# Patient Record
Sex: Female | Born: 1990 | Race: White | Hispanic: No | Marital: Single | Smoking: Never smoker
Health system: Southern US, Community
[De-identification: ages and names within clinical notes are randomized; demographics above are authoritative.]

## PROBLEM LIST (undated history)

## (undated) DIAGNOSIS — L709 Acne, unspecified: Secondary | ICD-10-CM

## (undated) DIAGNOSIS — F909 Attention-deficit hyperactivity disorder, unspecified type: Secondary | ICD-10-CM

## (undated) DIAGNOSIS — F419 Anxiety disorder, unspecified: Secondary | ICD-10-CM

---

## 2016-06-21 ENCOUNTER — Ambulatory Visit (HOSPITAL_COMMUNITY)
Admission: EM | Admit: 2016-06-21 | Discharge: 2016-06-21 | Disposition: A | Discharge status: Home / Self Care | Payer: BLUE CROSS/BLUE SHIELD | Attending: Family Medicine | Admitting: Family Medicine

## 2016-06-21 ENCOUNTER — Encounter (HOSPITAL_COMMUNITY): Payer: Self-pay | Admitting: Family Medicine

## 2016-06-21 DIAGNOSIS — L237 Allergic contact dermatitis due to plants, except food: Secondary | ICD-10-CM | POA: Diagnosis not present

## 2016-06-21 MED ORDER — PREDNISONE 20 MG PO TABS: 20.0000 mg | ORAL_TABLET | Freq: Every day | ORAL | 0 refills | Status: AC

## 2016-06-21 NOTE — ED Triage Notes (Signed)
Pt here for poison ivy. sts hx of same. Pt works outside.

## 2016-06-21 NOTE — Discharge Instructions (Signed)
Prescription given for prednisone taper. Also treat symptomatically with cool compress, oatmeal bath, calamine lotion, burrow's solution, and benadryl. No indication for antibiotic at this moment. Follow up with primary care doctor or return if rash does not improve.

## 2016-06-21 NOTE — ED Provider Notes (Signed)
CSN: [id number]     Arrival date & time 06/21/16  1448 History   First MD Initiated Contact with Patient 06/21/16 1719     Chief Complaint  Patient presents with  . Poison Ivy   (Consider location/radiation/quality/duration/timing/severity/associated sxs/prior Treatment) Nicole Henson is a well-appearing 25 y.o female, presents today for poison ivy exposure. She works outside a lot and believes she got exposed this past weekend. She reports rash on her arms, her back, and on her ankles. Rash have been present for 4-5 days. Rash is red and itchy. States "I am trying very hard not to scratch it". She have tried benadryl, which makes her very sleepy. She have also tried calamine lotion which doesn't seem to help. She have had poison ivy before and states that it feels the same today.     Poison Ivy     History reviewed. No pertinent past medical history. History reviewed. No pertinent surgical history. History reviewed. No pertinent family history. Social History  Substance Use Topics  . Smoking status: Never Smoker  . Smokeless tobacco: Never Used  . Alcohol use Not on file   OB History    No data available     Review of Systems  Skin: Positive for rash.  All other systems reviewed and are negative.   Allergies  Review of patient's allergies indicates no known allergies.  Home Medications   Prior to Admission medications   Medication Sig Start Date End Date Taking? Authorizing Provider  spironolactone (ALDACTONE) 100 MG tablet Take 100 mg by mouth 2 (two) times daily.   Yes Historical Provider, MD  predniSONE (DELTASONE) 20 MG tablet Take 1 tablet (20 mg total) by mouth daily with breakfast. Take 3 tablets for day 1-3, Take 2 tablets for day 4-6, Take 1 tablets for day 7-9. 06/21/16 06/30/16  Lucia EstelleFeng Kenzee Bassin, NP   Meds Ordered and Administered this Visit  Medications - No data to display  BP 123/68   Pulse 85   Temp 98.5 F (36.9 C)   Resp 18   SpO2 97%  No data  found.   Physical Exam  Constitutional: She is oriented to person, place, and time. She appears well-developed and well-nourished.  HENT:  Head: Normocephalic.  Cardiovascular: Normal rate.   Pulmonary/Chest: Effort normal.  Neurological: She is alert and oriented to person, place, and time.  Skin:  She has papulovesicular rash mainly on her bilateral arms, some on her left ankle, and some on her back. Majority of the rash are in linear formation, although some are discrete and confluent.   Psychiatric: She has a normal mood and affect.  Nursing note and vitals reviewed.   Urgent Care Course   Clinical Course    Procedures (including critical care time)  Labs Review Labs Reviewed - No data to display  Imaging Review No results found.    MDM   1. Poison ivy dermatitis     Rash consistent with poison ivy dermatitis. Prescription given for prednisone taper. Also treat symptomatically with cool compress, oatmeal bath, calamine lotion, burrow's solution, and benadryl. No indication for antibiotic at this moment. Follow up with PCP or return if rash does not improve. All questions answered. Discharge instruction given.     Lucia EstelleFeng Jb Dulworth, NP 06/21/16 1732

## 2016-06-30 ENCOUNTER — Emergency Department (HOSPITAL_COMMUNITY)
Admission: EM | Admit: 2016-06-30 | Discharge: 2016-06-30 | Disposition: A | Discharge status: Home / Self Care | Payer: BLUE CROSS/BLUE SHIELD | Attending: Emergency Medicine | Admitting: Emergency Medicine

## 2016-06-30 ENCOUNTER — Encounter (HOSPITAL_COMMUNITY): Payer: Self-pay | Admitting: *Deleted

## 2016-06-30 ENCOUNTER — Emergency Department (HOSPITAL_COMMUNITY): Payer: BLUE CROSS/BLUE SHIELD

## 2016-06-30 DIAGNOSIS — Y999 Unspecified external cause status: Secondary | ICD-10-CM | POA: Diagnosis not present

## 2016-06-30 DIAGNOSIS — S0992XA Unspecified injury of nose, initial encounter: Secondary | ICD-10-CM | POA: Insufficient documentation

## 2016-06-30 DIAGNOSIS — Y92410 Unspecified street and highway as the place of occurrence of the external cause: Secondary | ICD-10-CM | POA: Diagnosis not present

## 2016-06-30 DIAGNOSIS — S60212A Contusion of left wrist, initial encounter: Secondary | ICD-10-CM | POA: Diagnosis not present

## 2016-06-30 DIAGNOSIS — M542 Cervicalgia: Secondary | ICD-10-CM | POA: Diagnosis not present

## 2016-06-30 DIAGNOSIS — Y939 Activity, unspecified: Secondary | ICD-10-CM | POA: Diagnosis not present

## 2016-06-30 DIAGNOSIS — S20312A Abrasion of left front wall of thorax, initial encounter: Secondary | ICD-10-CM | POA: Diagnosis not present

## 2016-06-30 MED ORDER — HYDROCODONE-ACETAMINOPHEN 5-325 MG PO TABS
1.0000 | ORAL_TABLET | Freq: Once | ORAL | Status: AC
  Administered 2016-06-30: 1 via ORAL
  Filled 2016-06-30: qty 1

## 2016-06-30 MED ORDER — NAPROXEN 250 MG PO TABS
500.0000 mg | ORAL_TABLET | Freq: Once | ORAL | Status: AC
  Administered 2016-06-30: 500 mg via ORAL
  Filled 2016-06-30: qty 2

## 2016-06-30 NOTE — ED Notes (Signed)
Pt departed in NAD, refused use of a wheelchair.  
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
## 2016-06-30 NOTE — ED Triage Notes (Signed)
Ice pack given
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
## 2016-06-30 NOTE — ED Notes (Signed)
Patient transported to X-ray 
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
## 2019-01-22 ENCOUNTER — Other Ambulatory Visit: Payer: Self-pay | Admitting: Obstetrics & Gynecology

## 2019-01-22 DIAGNOSIS — N644 Mastodynia: Secondary | ICD-10-CM

## 2019-01-22 DIAGNOSIS — N6320 Unspecified lump in the left breast, unspecified quadrant: Secondary | ICD-10-CM

## 2019-02-12 ENCOUNTER — Encounter (HOSPITAL_COMMUNITY): Payer: Self-pay | Admitting: Emergency Medicine

## 2019-02-12 ENCOUNTER — Ambulatory Visit (HOSPITAL_COMMUNITY)
Admission: EM | Admit: 2019-02-12 | Discharge: 2019-02-12 | Disposition: A | Discharge status: Home / Self Care | Payer: BLUE CROSS/BLUE SHIELD | Attending: Family Medicine | Admitting: Family Medicine

## 2019-02-12 ENCOUNTER — Other Ambulatory Visit: Payer: Self-pay

## 2019-02-12 DIAGNOSIS — S61411A Laceration without foreign body of right hand, initial encounter: Secondary | ICD-10-CM

## 2019-02-12 DIAGNOSIS — W260XXA Contact with knife, initial encounter: Secondary | ICD-10-CM

## 2019-02-12 MED ORDER — LIDOCAINE-EPINEPHRINE (PF) 2 %-1:200000 IJ SOLN
INTRAMUSCULAR | Status: AC
  Filled 2019-02-12: qty 20

## 2019-02-12 NOTE — Discharge Instructions (Signed)
Bandage applied Keep covered for next and dry for next 24-48 hours.  After than you may gently clean with warm water and mild soap.  Avoid submerging wound in water. Change dressing daily and apply a thin layer of neosporin.  You may have sutures removed in 7-10 days Take OTC ibuprofen or tylenol as needed for pain releif Return or go to the ED if you have any new or worsening symptoms such as increased pain, redness, swelling, drainage, discharge, decreased range of motion of extremity, etc..
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
## 2019-02-24 ENCOUNTER — Ambulatory Visit
Admission: RE | Admit: 2019-02-24 | Discharge: 2019-02-24 | Disposition: A | Discharge status: Home / Self Care | Payer: BLUE CROSS/BLUE SHIELD | Source: Ambulatory Visit | Attending: Obstetrics & Gynecology | Admitting: Obstetrics & Gynecology

## 2019-02-24 ENCOUNTER — Other Ambulatory Visit: Payer: Self-pay

## 2019-02-24 ENCOUNTER — Other Ambulatory Visit: Payer: Self-pay | Admitting: Obstetrics & Gynecology

## 2019-02-24 DIAGNOSIS — N644 Mastodynia: Secondary | ICD-10-CM

## 2019-02-24 DIAGNOSIS — N6320 Unspecified lump in the left breast, unspecified quadrant: Secondary | ICD-10-CM

## 2019-09-01 ENCOUNTER — Ambulatory Visit
Admission: RE | Admit: 2019-09-01 | Discharge: 2019-09-01 | Disposition: A | Discharge status: Home / Self Care | Payer: BLUE CROSS/BLUE SHIELD | Source: Ambulatory Visit | Attending: Obstetrics & Gynecology | Admitting: Obstetrics & Gynecology

## 2019-09-01 ENCOUNTER — Other Ambulatory Visit: Payer: Self-pay

## 2019-09-01 ENCOUNTER — Other Ambulatory Visit: Payer: Self-pay | Admitting: Obstetrics & Gynecology

## 2019-09-01 DIAGNOSIS — N644 Mastodynia: Secondary | ICD-10-CM

## 2019-09-01 DIAGNOSIS — N6320 Unspecified lump in the left breast, unspecified quadrant: Secondary | ICD-10-CM

## 2019-12-04 ENCOUNTER — Ambulatory Visit (HOSPITAL_COMMUNITY)
Admission: EM | Admit: 2019-12-04 | Discharge: 2019-12-04 | Disposition: A | Discharge status: Home / Self Care | Payer: BLUE CROSS/BLUE SHIELD | Attending: Family Medicine | Admitting: Family Medicine

## 2019-12-04 ENCOUNTER — Other Ambulatory Visit: Payer: Self-pay

## 2019-12-04 ENCOUNTER — Encounter (HOSPITAL_COMMUNITY): Payer: Self-pay

## 2019-12-04 DIAGNOSIS — Z3202 Encounter for pregnancy test, result negative: Secondary | ICD-10-CM | POA: Diagnosis not present

## 2019-12-04 DIAGNOSIS — R1032 Left lower quadrant pain: Secondary | ICD-10-CM | POA: Diagnosis not present

## 2019-12-04 DIAGNOSIS — R300 Dysuria: Secondary | ICD-10-CM | POA: Insufficient documentation

## 2019-12-04 HISTORY — DX: Anxiety disorder, unspecified: F41.9

## 2019-12-04 HISTORY — DX: Attention-deficit hyperactivity disorder, unspecified type: F90.9

## 2019-12-04 HISTORY — DX: Acne, unspecified: L70.9

## 2019-12-04 LAB — POCT URINALYSIS DIP (DEVICE)
Bilirubin Urine: NEGATIVE
Glucose, UA: NEGATIVE mg/dL
Hgb urine dipstick: NEGATIVE
Ketones, ur: NEGATIVE mg/dL
Leukocytes,Ua: NEGATIVE
Nitrite: NEGATIVE
Protein, ur: NEGATIVE mg/dL
Specific Gravity, Urine: 1.01 (ref 1.005–1.030)
Urobilinogen, UA: 0.2 mg/dL (ref 0.0–1.0)
pH: 6.5 (ref 5.0–8.0)

## 2019-12-04 LAB — POC URINE PREG, ED
Preg Test, Ur: NEGATIVE
Preg Test, Ur: NEGATIVE

## 2019-12-04 LAB — POCT PREGNANCY, URINE: Preg Test, Ur: NEGATIVE

## 2019-12-04 MED ORDER — CIPROFLOXACIN HCL 500 MG PO TABS: 500.0000 mg | ORAL_TABLET | Freq: Two times a day (BID) | ORAL | 0 refills | Status: AC

## 2019-12-04 NOTE — ED Triage Notes (Signed)
Pt c/o LLQ pain and "swelling" since Monday. Also reports painful urination, frequency, urgency and hematuria since Tuesday. Denies n/v, fever, chills. Also c/o lower right back pain, but states h/o similar pain.  States had normal BM yesterday and LLQ pain improved slightly.

## 2019-12-04 NOTE — Discharge Instructions (Signed)
Take antibiotic 2 times a day Continue to drink plenty of fluids Make sure your diet contains enough fiber Return if you are worse instead of better

## 2019-12-04 NOTE — ED Provider Notes (Signed)
[facility]    CSN: [id number] Arrival date & time: 12/04/19  1854      History   Chief Complaint Chief Complaint  Patient presents with  . Dysuria  . Abdominal Pain    HPI Nicole Henson is a 29 y.o. female.   HPI  Patient has left lower quadrant crampy pain.  Mild suprapubic pain.  Painful urination, frequency, urgency, and trace hematuria since Tuesday.  Notes 2 days.  No nausea or vomiting.  No fever or chills.  No history of kidney stones or kidney infection.  She does have some right lower back and flank pain. Patient states she had been constipated.  When she had a bowel movement yesterday her abdominal pain improved slightly, then came back.  She is not usually constipated.  She did not have a bowel movement today.  Past Medical History:  Diagnosis Date  . Acne   . ADHD   . Anxiety     There are no problems to display for this patient.   History reviewed. No pertinent surgical history.  OB History   No obstetric history on file.      Home Medications    Prior to Admission medications   Medication Sig Start Date End Date Taking? Authorizing Provider  ALPRAZolam Prudy Feeler) 0.5 MG tablet  09/23/19  Yes [provider]  amphetamine-dextroamphetamine (ADDERALL) 20 MG tablet Take 20 mg by mouth 2 (two) times daily.   Yes [provider]  sertraline (ZOLOFT) 100 MG tablet Take 100 mg by mouth daily. 12/04/19  Yes [provider]  spironolactone (ALDACTONE) 100 MG tablet Take 100 mg by mouth 2 (two) times daily.   Yes [provider]  ciprofloxacin (CIPRO) 500 MG tablet Take 1 tablet (500 mg total) by mouth 2 (two) times daily. 12/04/19   Eustace Moore, MD  hydrocortisone cream 1 % Apply 1 application topically 2 (two) times daily as needed for itching.    [provider]  sertraline (ZOLOFT) 50 MG tablet Take 50 mg by mouth daily.    [provider]    Family History Family History    Problem Relation Age of Onset  . Healthy Mother   . Diabetes Father     Social History Social History   Tobacco Use  . Smoking status: Never Smoker  . Smokeless tobacco: Never Used  Substance Use Topics  . Alcohol use: Yes  . Drug use: Yes    Types: Marijuana     Allergies   Patient has no known allergies.   Review of Systems Review of Systems  Constitutional: Negative for chills and fever.  Gastrointestinal: Positive for abdominal pain and constipation. Negative for nausea and vomiting.  Genitourinary: Positive for dysuria, flank pain, frequency and hematuria. Negative for menstrual problem, pelvic pain and vaginal discharge.     Physical Exam Triage Vital Signs ED Triage Vitals  Enc Vitals Group     BP 12/04/19 1910 118/72     Pulse Rate 12/04/19 1910 92     Resp 12/04/19 1910 18     Temp 12/04/19 1910 99.2 F (37.3 C)     Temp Source 12/04/19 1910 Oral     SpO2 12/04/19 1910 99 %     Weight --      Height --      Head Circumference --      Peak Flow --      Pain Score 12/04/19 1906 3     Pain Loc --  Pain Edu? --      Excl. in [place]? --    No data found.  Updated Vital Signs BP 118/72 (BP Location: Left Arm)   Pulse 92   Temp 99.2 F (37.3 C) (Oral)   Resp 18   SpO2 99%      Physical Exam Constitutional:      General: She is not in acute distress.    Appearance: She is well-developed and normal weight.     Comments: Patient is lean  HENT:     Head: Normocephalic and atraumatic.     Mouth/Throat:     Comments: Mask in place Eyes:     Conjunctiva/sclera: Conjunctivae normal.     Pupils: Pupils are equal, round, and reactive to light.  Cardiovascular:     Rate and Rhythm: Normal rate and regular rhythm.     Heart sounds: Normal heart sounds.  Pulmonary:     Effort: Pulmonary effort is normal. No respiratory distress.     Breath sounds: Normal breath sounds.  Abdominal:     General: Abdomen is flat. Bowel sounds are normal. There is no  distension.     Palpations: Abdomen is soft.     Tenderness: There is abdominal tenderness. There is no right CVA tenderness or left CVA tenderness.     Comments: Mild tenderness to deep palpation in the left lower quadrant.  No guarding rebound  Musculoskeletal:        General: Normal range of motion.     Cervical back: Normal range of motion.  Skin:    General: Skin is warm and dry.  Neurological:     General: No focal deficit present.     Mental Status: She is alert.  Psychiatric:        Mood and Affect: Mood normal.        Behavior: Behavior normal.      UC Treatments / Results  Labs (all labs ordered are listed, but only abnormal results are displayed) Labs Reviewed  URINE CULTURE  POCT URINALYSIS DIP (DEVICE)  POCT PREGNANCY, URINE  POC URINE PREG, ED  POC URINE PREG, ED  Pregnancy negative Dip UA negative EKG   Radiology No results found.  Procedures Procedures (including critical care time)  Medications Ordered in UC Medications - No data to display  Initial Impression / Assessment and Plan / UC Course  I have reviewed the triage vital signs and the nursing notes.  Pertinent labs & imaging results that were available during my care of the patient were reviewed by me and considered in my medical decision making (see chart for details).     Symptoms are consistent with cystitis, urinalysis is not.  We will culture urine and treat with antibiotics.  Recommend increase fluids.  Discussed fiber supplementation.  Will culture urine and call patient if needed. Final Clinical Impressions(s) / UC Diagnoses   Final diagnoses:  Dysuria  Left lower quadrant abdominal pain     Discharge Instructions     Take antibiotic 2 times a day Continue to drink plenty of fluids Make sure your diet contains enough fiber Return if you are worse instead of better   ED Prescriptions    Medication Sig Dispense Auth. Provider   ciprofloxacin (CIPRO) 500 MG tablet Take 1  tablet (500 mg total) by mouth 2 (two) times daily. 10 tablet Raylene Everts, MD     PDMP not reviewed this encounter.   Raylene Everts, MD 12/04/19 [phone]

## 2019-12-06 LAB — URINE CULTURE: Culture: 60000 — AB

## 2020-01-01 ENCOUNTER — Ambulatory Visit: Payer: BLUE CROSS/BLUE SHIELD | Attending: Internal Medicine

## 2020-01-01 DIAGNOSIS — Z20822 Contact with and (suspected) exposure to covid-19: Secondary | ICD-10-CM

## 2020-01-02 LAB — SARS-COV-2, NAA 2 DAY TAT

## 2020-01-02 LAB — NOVEL CORONAVIRUS, NAA: SARS-CoV-2, NAA: NOT DETECTED

## 2020-03-02 ENCOUNTER — Other Ambulatory Visit: Payer: Self-pay

## 2020-03-02 ENCOUNTER — Ambulatory Visit
Admission: RE | Admit: 2020-03-02 | Discharge: 2020-03-02 | Disposition: A | Discharge status: Home / Self Care | Payer: BLUE CROSS/BLUE SHIELD | Source: Ambulatory Visit | Attending: Obstetrics & Gynecology | Admitting: Obstetrics & Gynecology

## 2020-03-02 DIAGNOSIS — N6320 Unspecified lump in the left breast, unspecified quadrant: Secondary | ICD-10-CM

## 2020-05-31 IMAGING — US ULTRASOUND LEFT BREAST LIMITED
1 series · 10 of 10 positions shown · non-contrast
Comparison: None.

CLINICAL DATA: 28-year-old female presenting for evaluation of a
palpable retroareolar left breast lump which has been present and
unchanged for 2 years other than more recently it has become more
tender. On clinical breast exam, another palpable area was
identified lateral to this retroareolar lump.

EXAM:
ULTRASOUND OF THE LEFT BREAST

[Series 1: ultrasound left breast limited · 0.04mm/px · 10 of 10 slices shown]
[im 1/10]
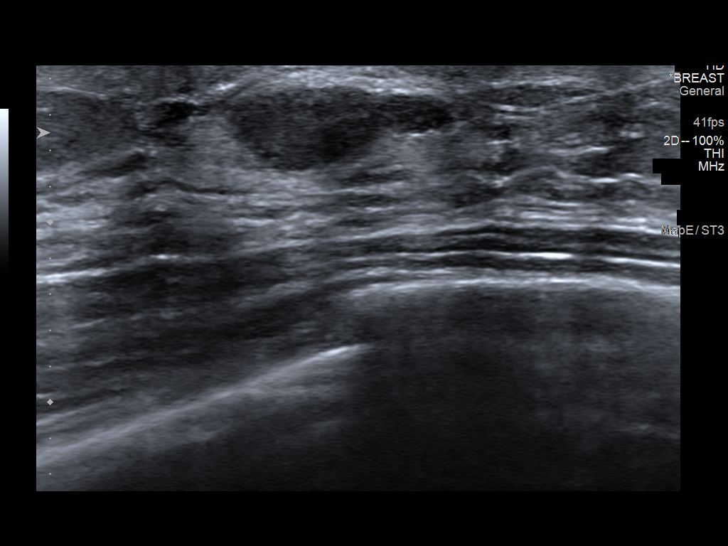
[im 2/10]
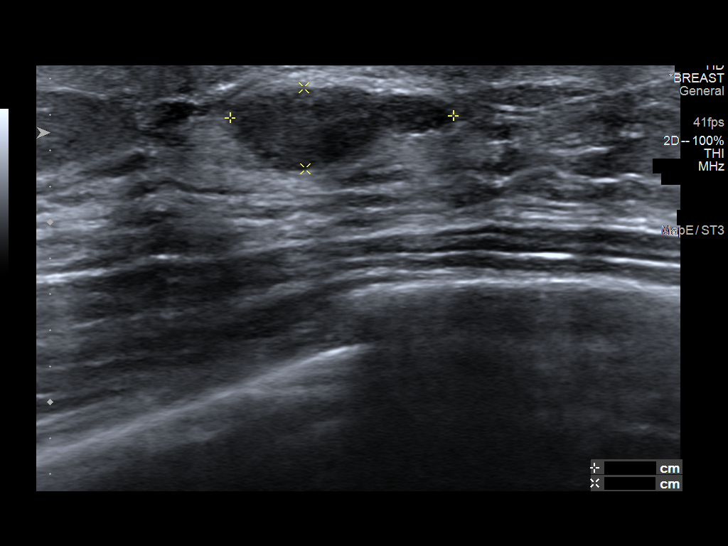
[im 3/10]
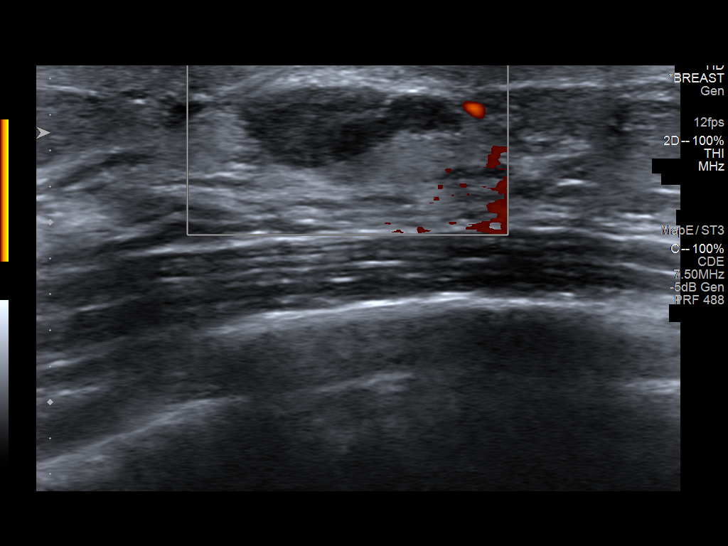
[im 4/10]
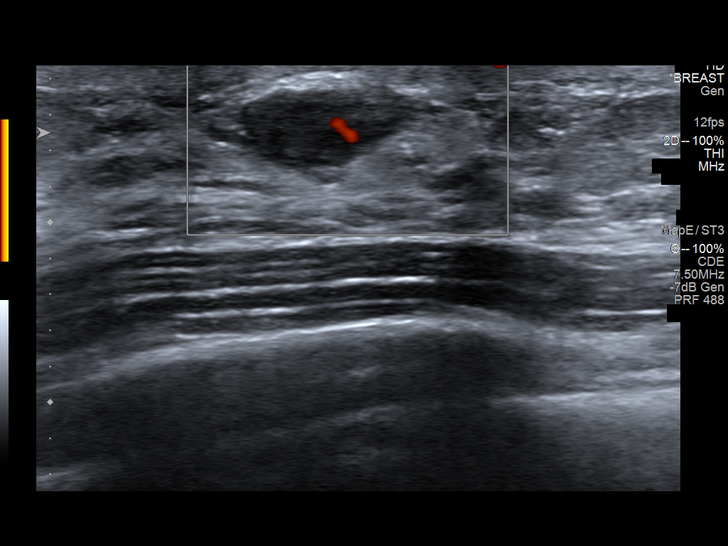
[im 5/10]
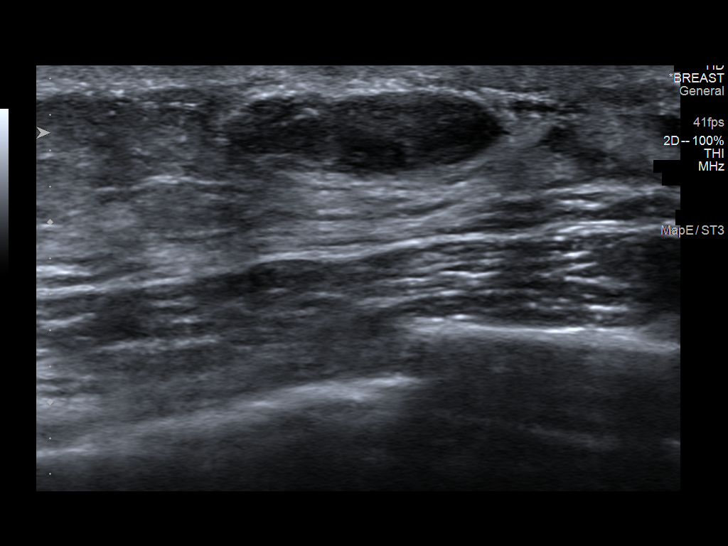
[im 6/10]
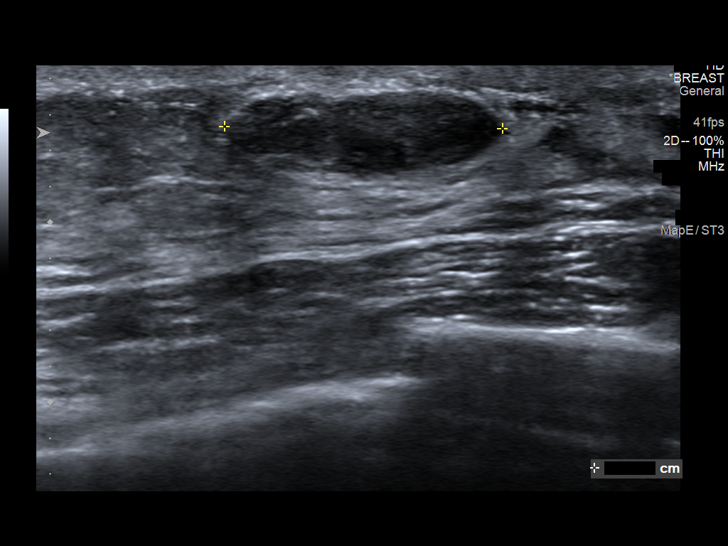
[im 7/10]
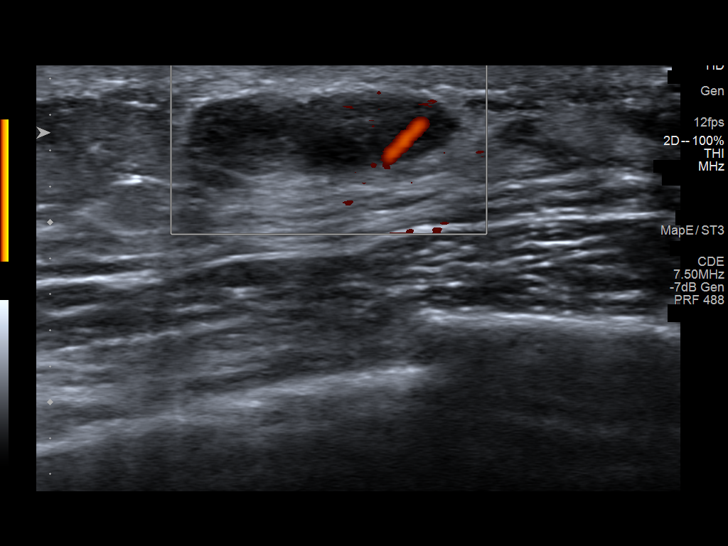
[im 8/10]
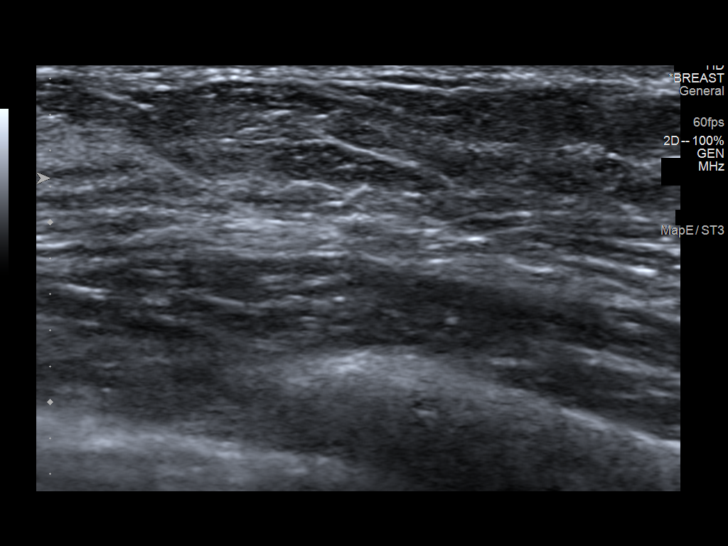
[im 9/10]
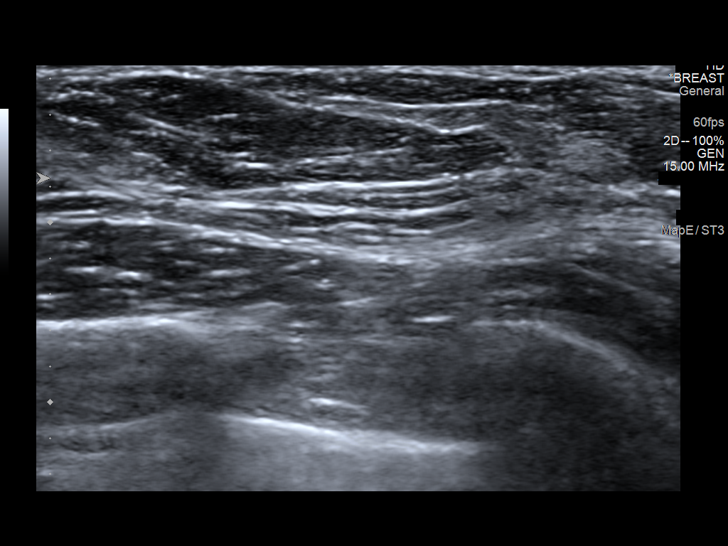
[im 10/10]
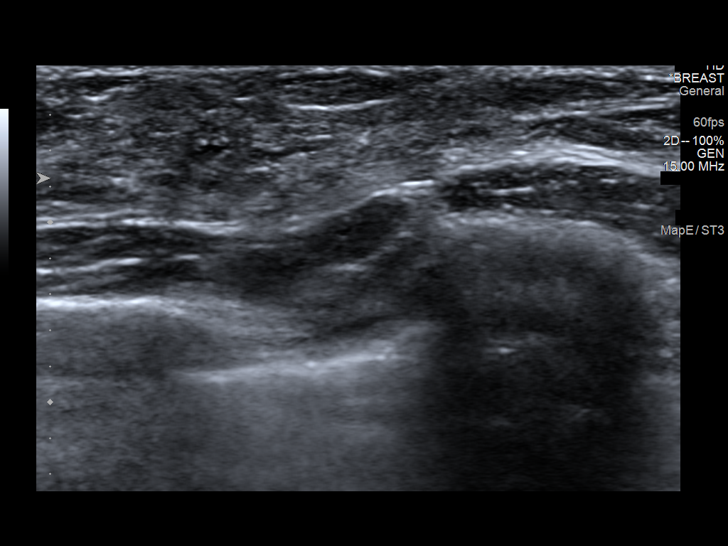

[10 of 10 positions shown; findings below may reference images not displayed]

FINDINGS: On physical exam, no suspicious palpable masses are identified in
the lateral left breast. There is a smooth mobile mass in the
retroareolar left breast corresponding with the palpable site
identified by the patient.

In the retroareolar left breast at 9 o'clock, there is a
circumscribed oval hypoechoic mass measuring 1.6 x 0.5 x 1.2 cm.
IMPRESSION: 1. The palpable retroareolar left breast mass corresponds with a
probably benign mass, likely a fibroadenoma.

2. No suspicious palpable masses are identified in the lateral
aspect of the left breast.

RECOMMENDATION:
1.  A six-month follow-up left breast ultrasound is recommended.

2. Clinical follow-up recommended for the palpable area of concern
in the lateral left breast identified on clinical breast exam. Any
further workup should be based on clinical grounds.

I have discussed the findings and recommendations with the patient.
Results were also provided in writing at the conclusion of the
visit. If applicable, a reminder letter will be sent to the patient
regarding the next appointment.

BI-RADS CATEGORY  3: Probably benign.

## 2021-03-03 ENCOUNTER — Other Ambulatory Visit: Payer: Self-pay | Admitting: Obstetrics and Gynecology

## 2021-03-03 ENCOUNTER — Other Ambulatory Visit: Payer: Self-pay | Admitting: Obstetrics & Gynecology

## 2021-03-03 DIAGNOSIS — N6320 Unspecified lump in the left breast, unspecified quadrant: Secondary | ICD-10-CM

## 2021-03-28 ENCOUNTER — Ambulatory Visit
Admission: RE | Admit: 2021-03-28 | Discharge: 2021-03-28 | Disposition: A | Discharge status: Home / Self Care | Payer: BLUE CROSS/BLUE SHIELD | Source: Ambulatory Visit | Attending: Obstetrics and Gynecology | Admitting: Obstetrics and Gynecology

## 2021-03-28 ENCOUNTER — Other Ambulatory Visit: Payer: Self-pay

## 2021-03-28 DIAGNOSIS — N6320 Unspecified lump in the left breast, unspecified quadrant: Secondary | ICD-10-CM

## 2021-06-07 IMAGING — US US BREAST*L* LIMITED INC AXILLA
1 series · 5 of 5 positions shown · non-contrast
Comparison: Previous exam(s).

CLINICAL DATA: Patient for follow-up of probably benign left breast
mass.

EXAM:
ULTRASOUND OF THE LEFT BREAST

[Series 1: us breast*left* limited inc axilla · 0.04mm/px · 5 of 5 slices shown]
[im 1/5]
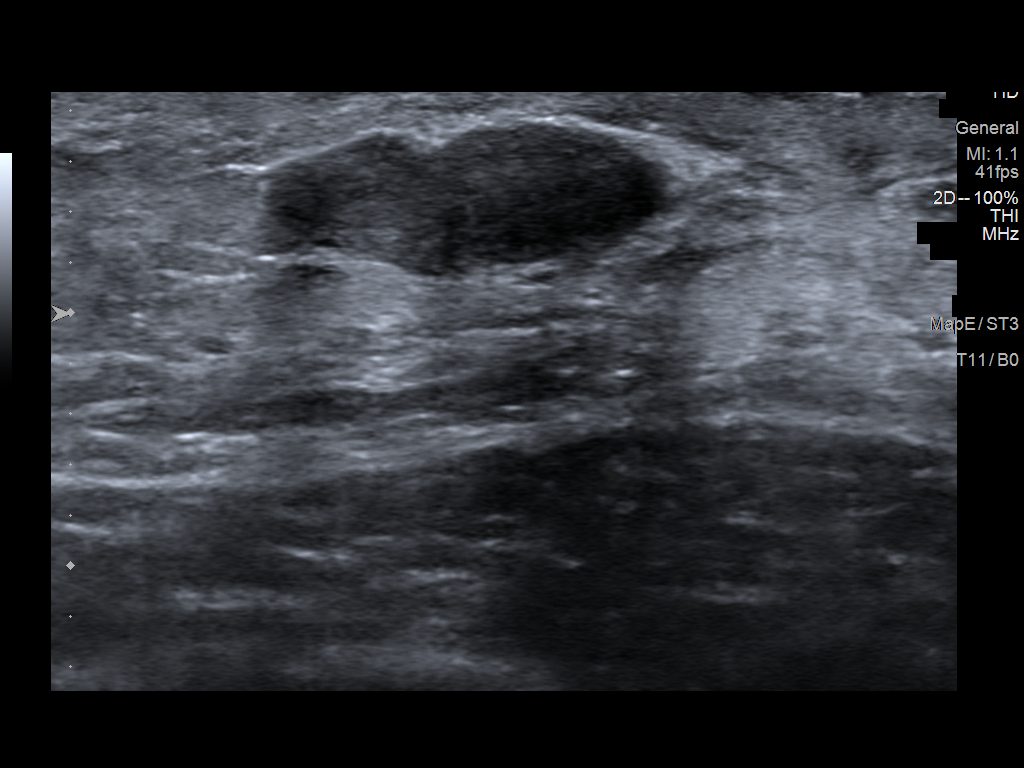
[im 2/5]
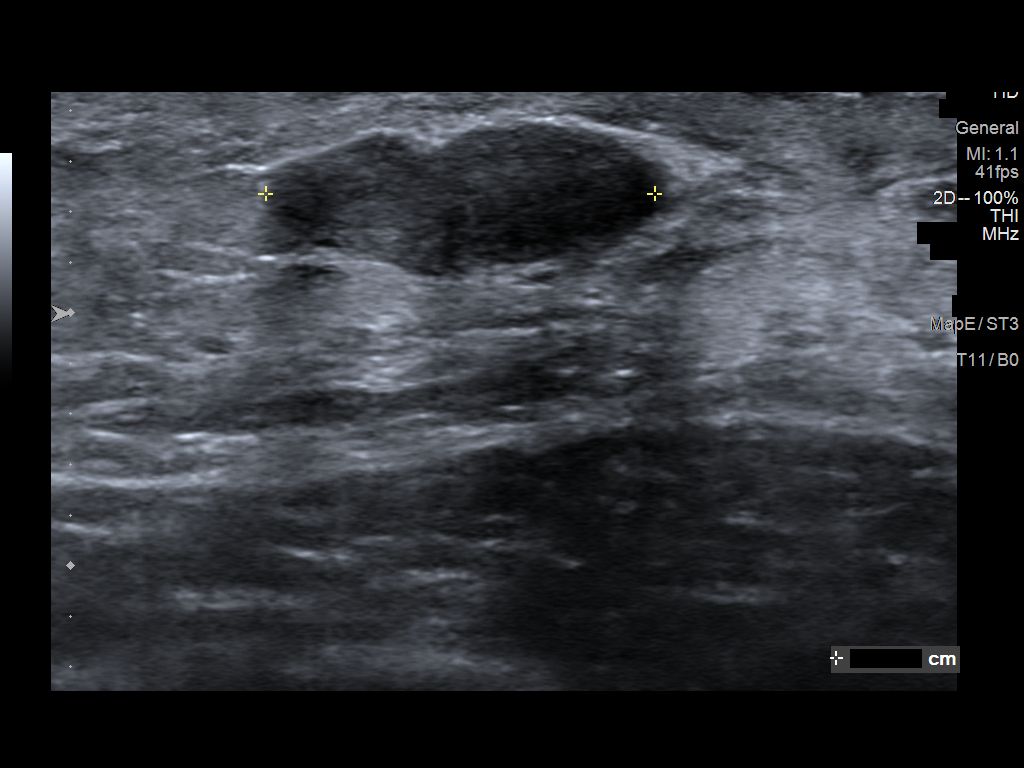
[im 3/5]
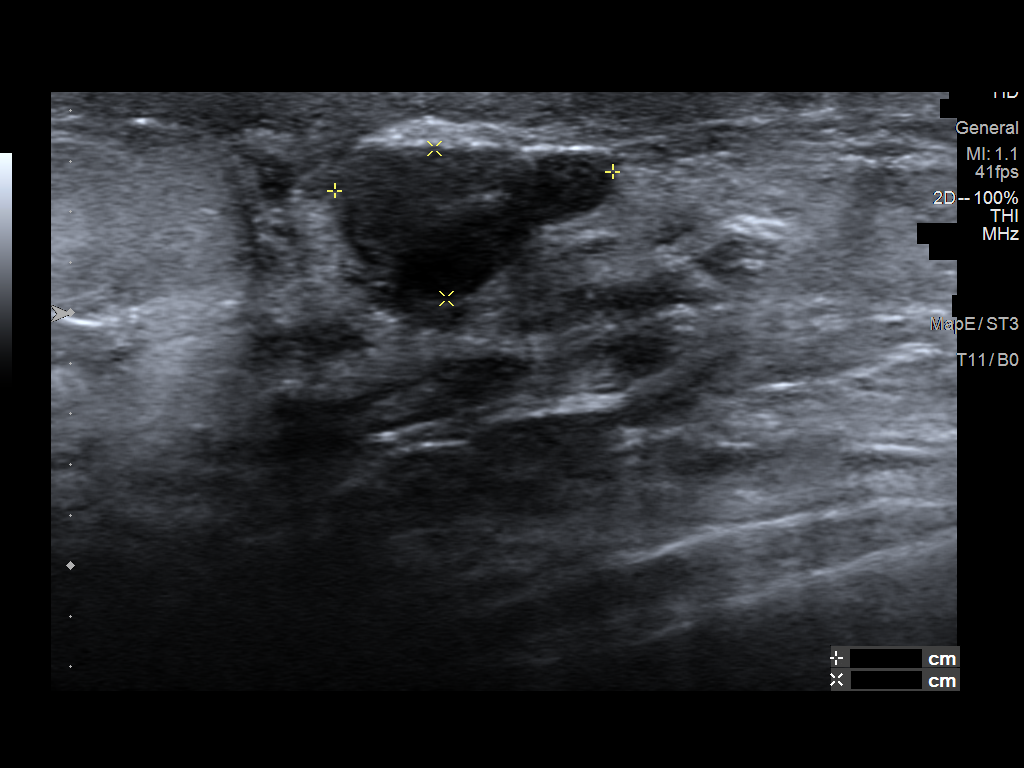
[im 4/5]
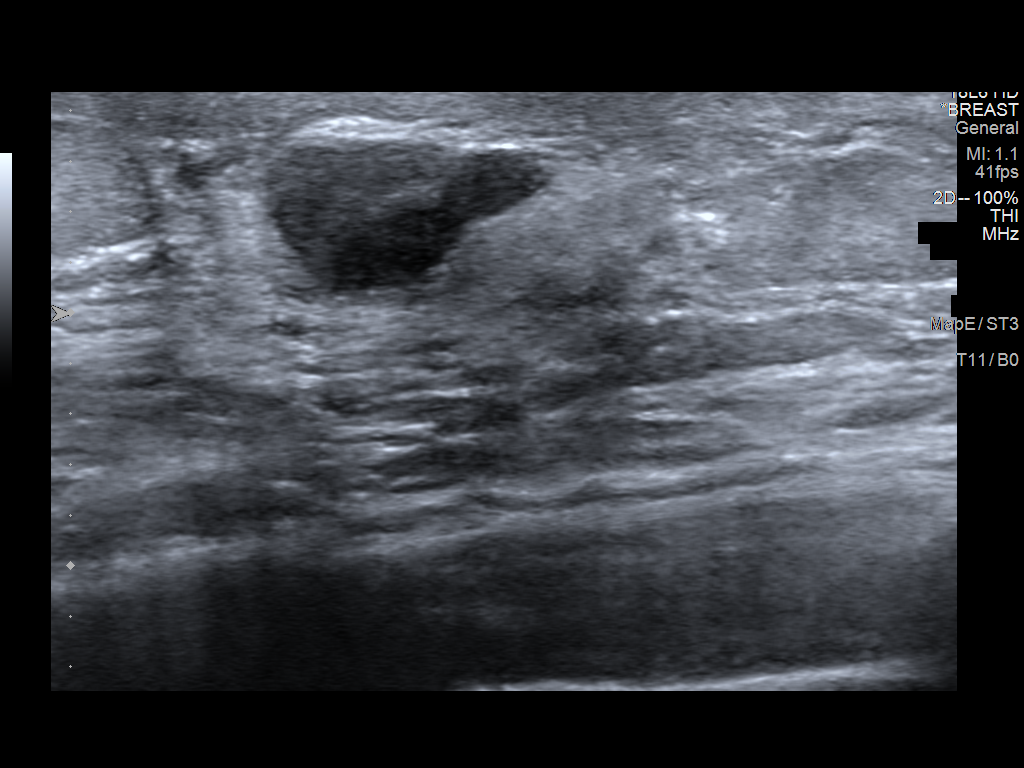
[im 5/5]
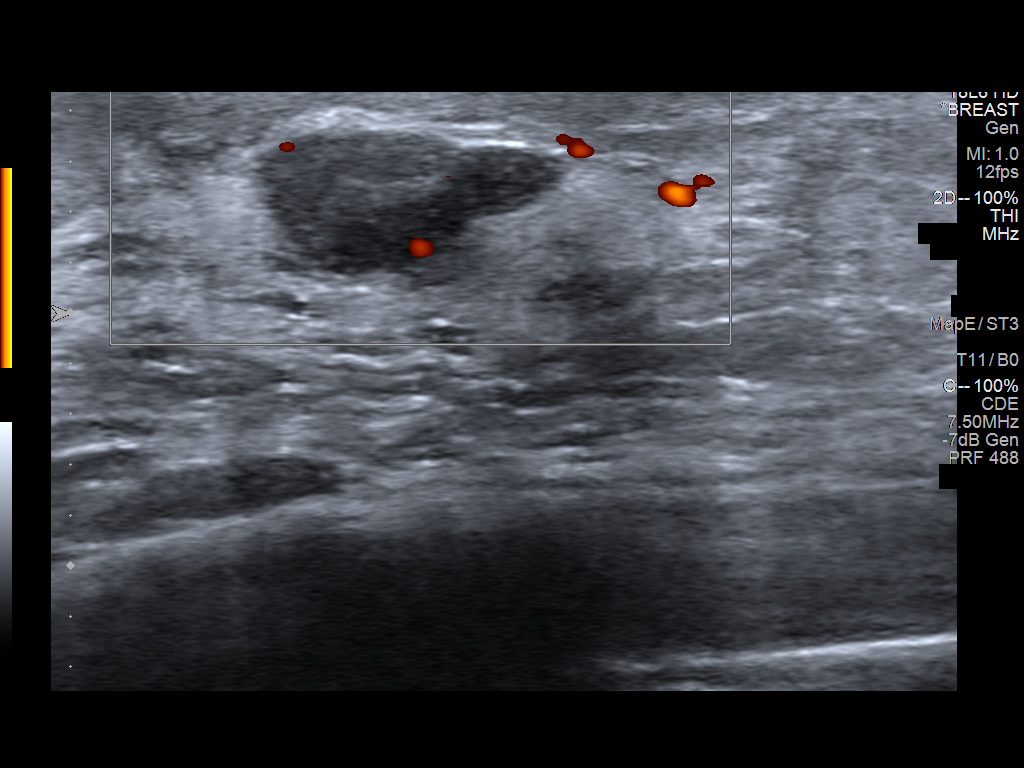

[5 of 5 positions shown; findings below may reference images not displayed]

FINDINGS: Targeted ultrasound is performed, showing a stable 1.5 x 1.1 x
cm oval circumscribed hypoechoic mass left breast 9 o'clock
position.
IMPRESSION: Stable probably benign left breast mass.

RECOMMENDATION:
Left breast ultrasound in 12 months to demonstrate 2 years of
stability of probably benign left breast mass.

I have discussed the findings and recommendations with the patient.
If applicable, a reminder letter will be sent to the patient
regarding the next appointment.

BI-RADS CATEGORY  3: Probably benign.

## 2022-07-03 IMAGING — US US BREAST*L* LIMITED INC AXILLA
1 series · 7 of 7 positions shown · non-contrast
Comparison: Previous exam(s).

CLINICAL DATA: Follow-up of a left breast mass

EXAM:
ULTRASOUND OF THE LEFT BREAST

[Series 1: us breast*left* limited inc axilla · 0.05mm/px · 7 of 7 slices shown]
[im 1/7]
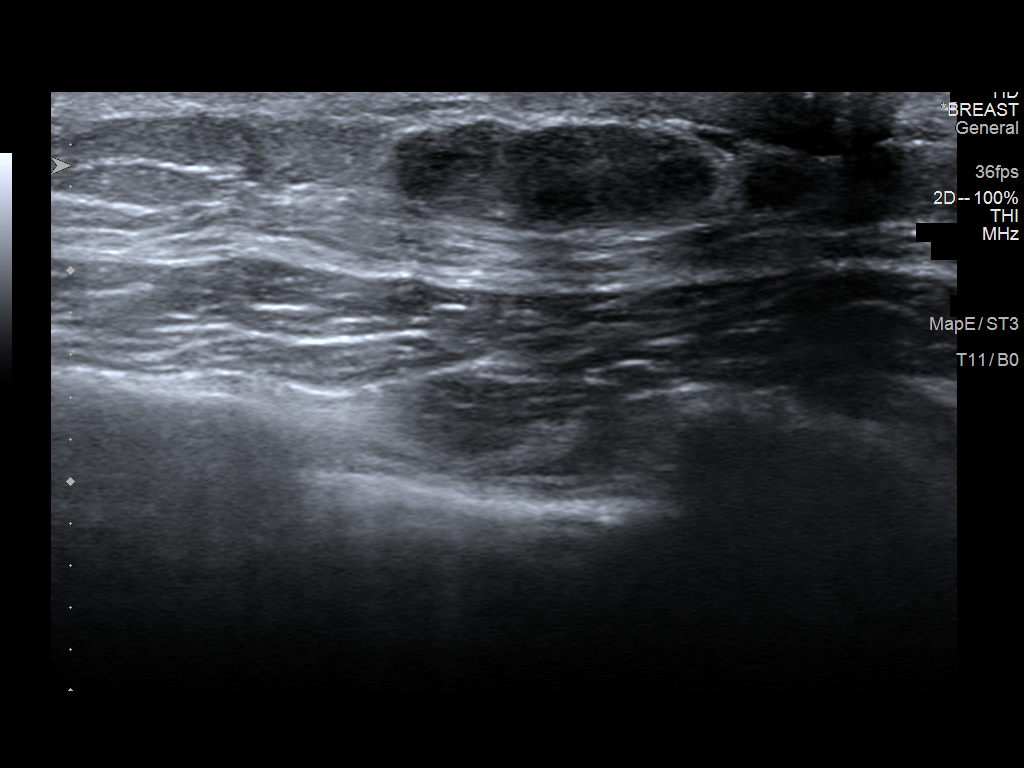
[im 2/7]
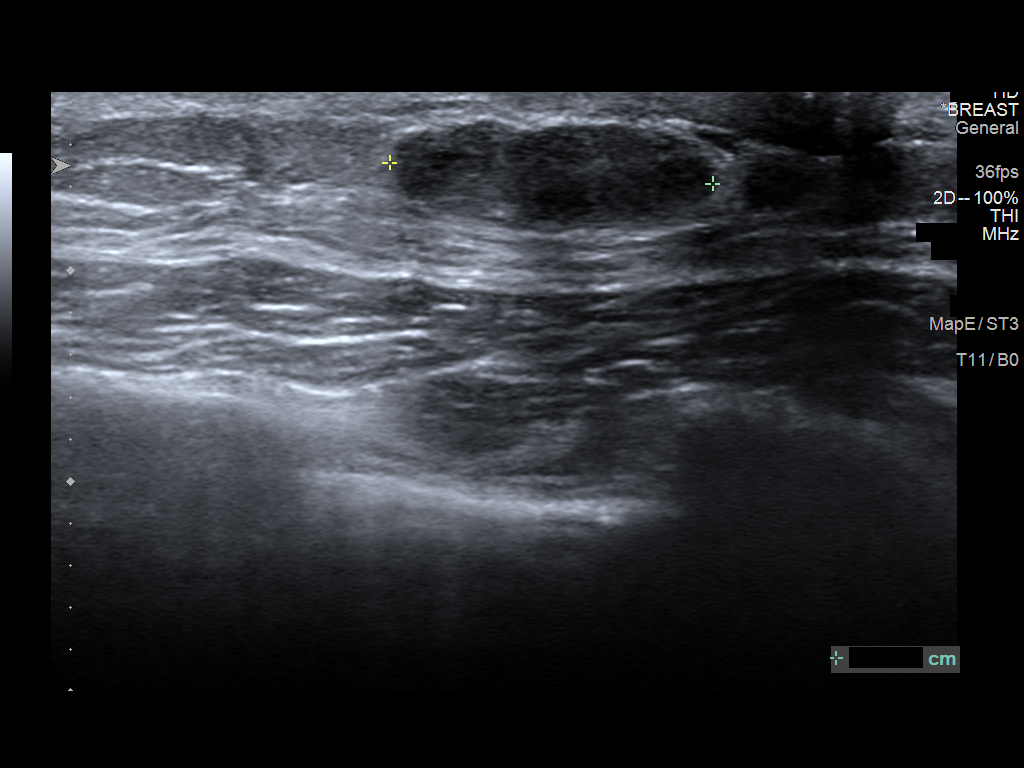
[im 3/7]
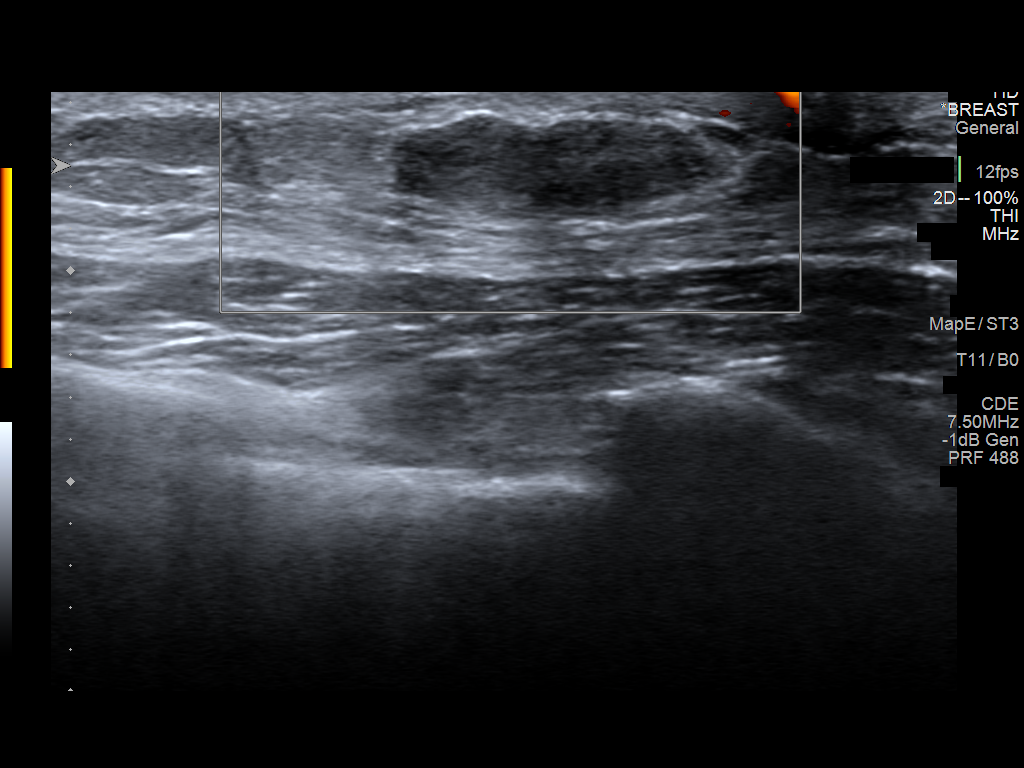
[im 4/7]
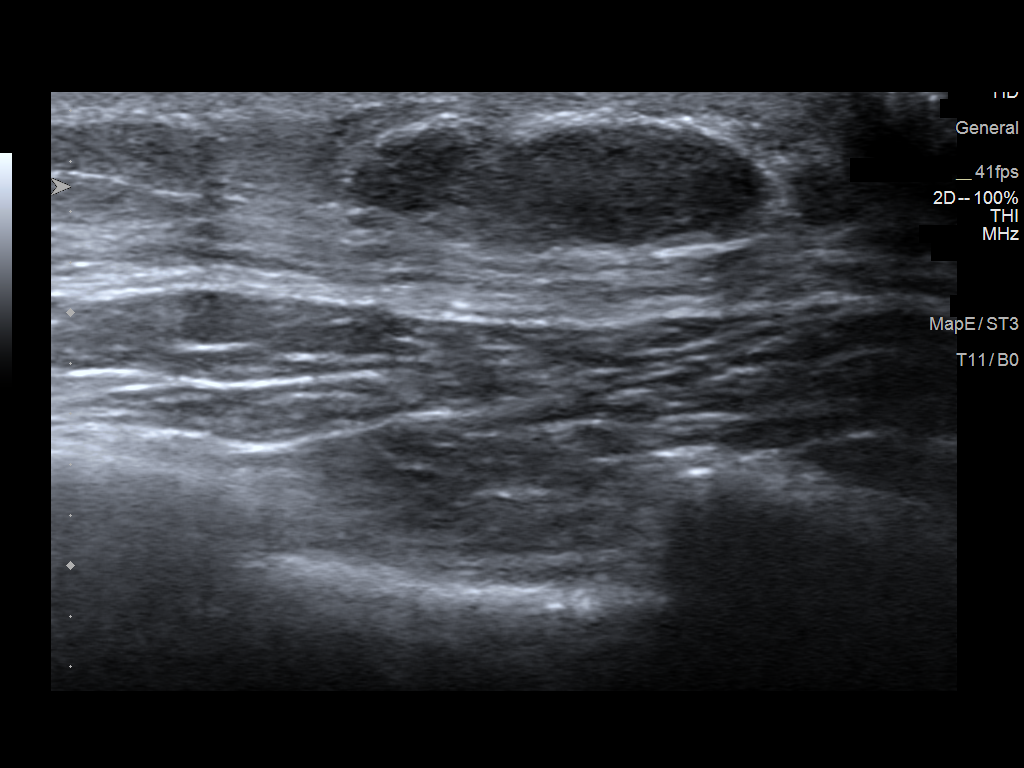
[im 5/7]
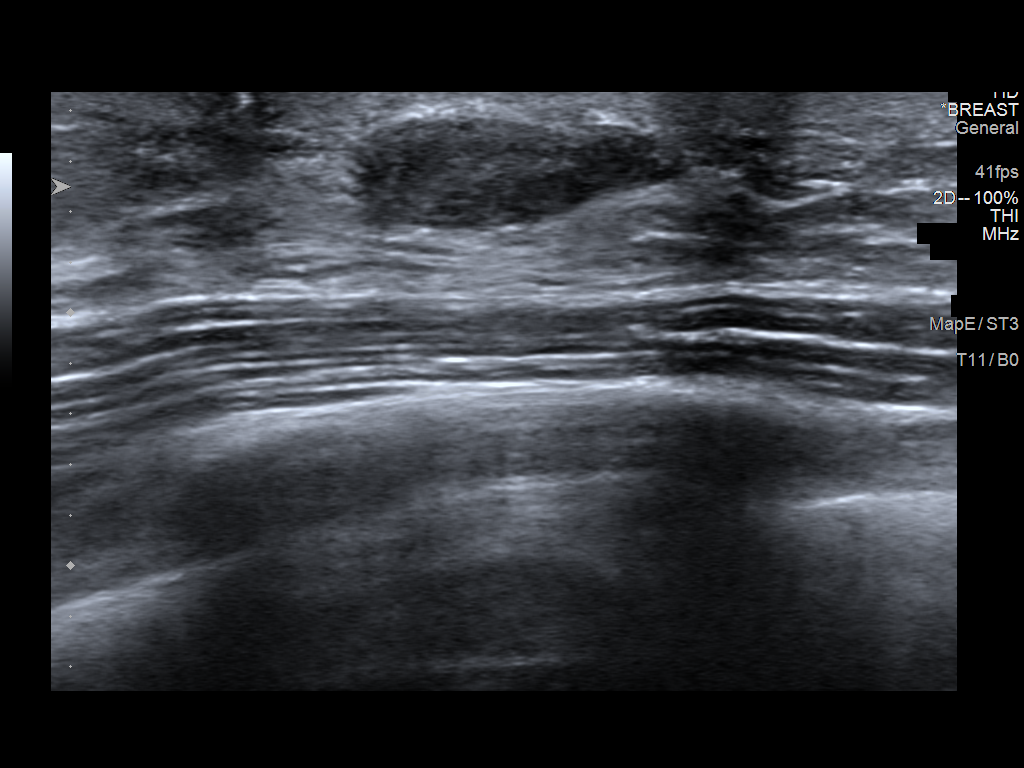
[im 6/7]
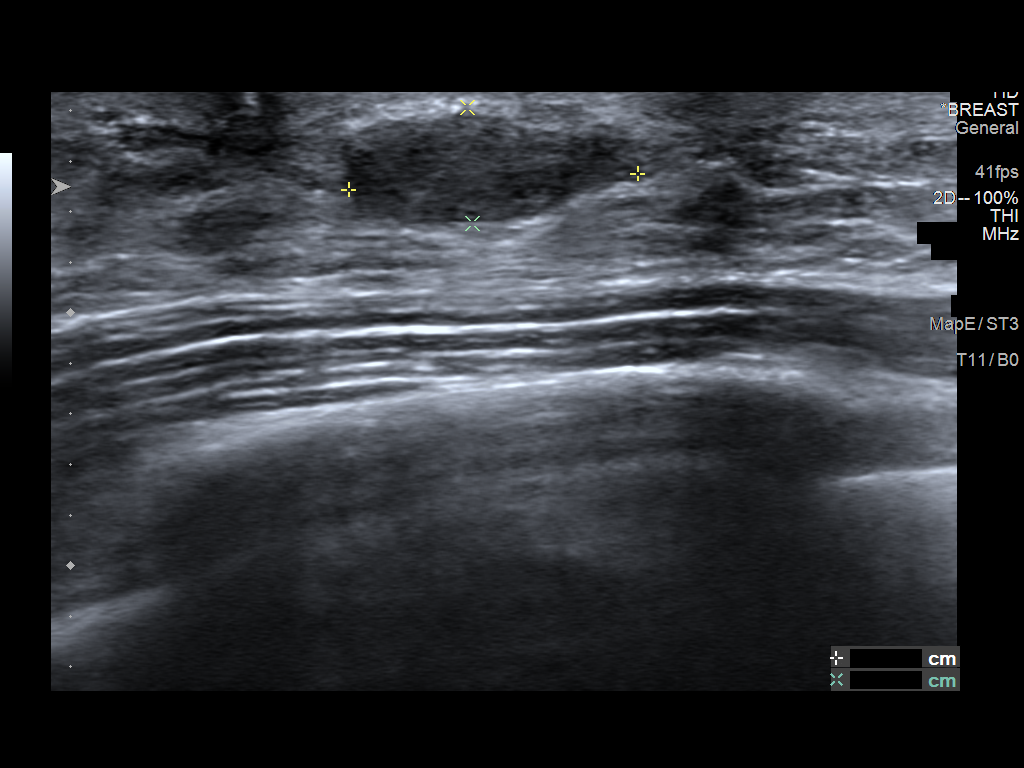
[im 7/7]
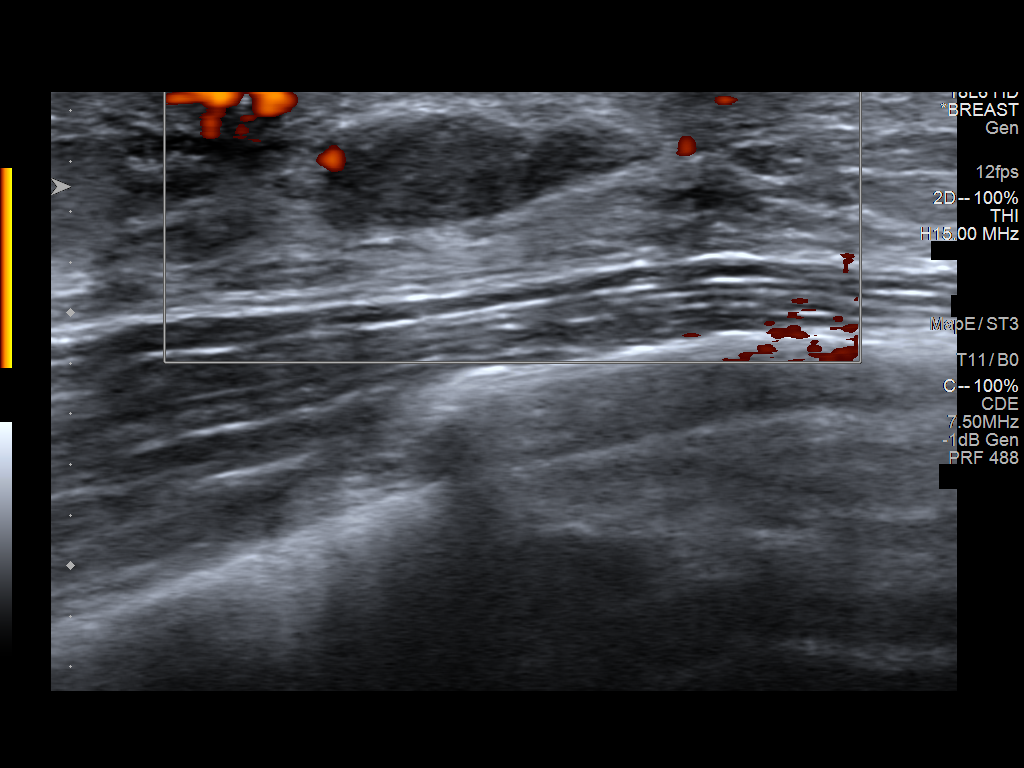

[7 of 7 positions shown; findings below may reference images not displayed]

FINDINGS: On physical exam, no new lumps are identified.

Targeted ultrasound is performed, showing a mass in the left breast
at 9 o'clock in the retroareolar region measuring 1.5 x 1.1 x
cm, unchanged since February 24, 2019.
IMPRESSION: Stable benign mass in the left breast.

RECOMMENDATION:
Annual screening mammography beginning at the age of 40.

I have discussed the findings and recommendations with the patient.
If applicable, a reminder letter will be sent to the patient
regarding the next appointment.

BI-RADS CATEGORY  2: Benign.
